# Patient Record
Sex: Female | Born: 1950 | Race: White | Hispanic: No | Marital: Single | Smoking: Never smoker
Health system: Southern US, Community
[De-identification: ages and names within clinical notes are randomized; demographics above are authoritative.]

## PROBLEM LIST (undated history)

## (undated) DIAGNOSIS — K121 Other forms of stomatitis: Secondary | ICD-10-CM

## (undated) DIAGNOSIS — K5792 Diverticulitis of intestine, part unspecified, without perforation or abscess without bleeding: Secondary | ICD-10-CM

---

## 2019-09-10 ENCOUNTER — Emergency Department (HOSPITAL_BASED_OUTPATIENT_CLINIC_OR_DEPARTMENT_OTHER)
Admission: EM | Admit: 2019-09-10 | Discharge: 2019-09-10 | Disposition: A | Discharge status: Home / Self Care | Payer: Medicare Other | Attending: Emergency Medicine | Admitting: Emergency Medicine

## 2019-09-10 ENCOUNTER — Other Ambulatory Visit: Payer: Self-pay

## 2019-09-10 ENCOUNTER — Encounter (HOSPITAL_BASED_OUTPATIENT_CLINIC_OR_DEPARTMENT_OTHER): Payer: Self-pay | Admitting: Emergency Medicine

## 2019-09-10 DIAGNOSIS — R630 Anorexia: Secondary | ICD-10-CM | POA: Diagnosis not present

## 2019-09-10 DIAGNOSIS — R109 Unspecified abdominal pain: Secondary | ICD-10-CM | POA: Diagnosis present

## 2019-09-10 DIAGNOSIS — R110 Nausea: Secondary | ICD-10-CM

## 2019-09-10 DIAGNOSIS — U071 COVID-19: Secondary | ICD-10-CM | POA: Diagnosis not present

## 2019-09-10 HISTORY — DX: Other forms of stomatitis: K12.1

## 2019-09-10 HISTORY — DX: Diverticulitis of intestine, part unspecified, without perforation or abscess without bleeding: K57.92

## 2019-09-10 LAB — COMPREHENSIVE METABOLIC PANEL
ALT: 20 U/L (ref 0–44)
AST: 29 U/L (ref 15–41)
Albumin: 3.6 g/dL (ref 3.5–5.0)
Alkaline Phosphatase: 54 U/L (ref 38–126)
Anion gap: 10 (ref 5–15)
BUN: 9 mg/dL (ref 8–23)
CO2: 29 mmol/L (ref 22–32)
Calcium: 8.7 mg/dL — ABNORMAL LOW (ref 8.9–10.3)
Chloride: 97 mmol/L — ABNORMAL LOW (ref 98–111)
Creatinine, Ser: 0.92 mg/dL (ref 0.44–1.00)
GFR calc Af Amer: >60 mL/min (ref >60)
GFR calc non Af Amer: >60 mL/min (ref >60)
Glucose, Bld: 119 mg/dL — ABNORMAL HIGH (ref 70–99)
Potassium: 3.9 mmol/L (ref 3.5–5.1)
Sodium: 136 mmol/L (ref 135–145)
Total Bilirubin: 1.1 mg/dL (ref 0.3–1.2)
Total Protein: 7 g/dL (ref 6.5–8.1)

## 2019-09-10 LAB — CBC WITH DIFFERENTIAL/PLATELET
Abs Immature Granulocytes: 0.01 10*3/uL (ref 0.00–0.07)
Basophils Absolute: 0 10*3/uL (ref 0.0–0.1)
Basophils Relative: 0 %
Eosinophils Absolute: 0 10*3/uL (ref 0.0–0.5)
Eosinophils Relative: 0 %
HCT: 49 % — ABNORMAL HIGH (ref 36.0–46.0)
Hemoglobin: 15.6 g/dL — ABNORMAL HIGH (ref 12.0–15.0)
Immature Granulocytes: 0 %
Lymphocytes Relative: 26 %
Lymphs Abs: 1.3 10*3/uL (ref 0.7–4.0)
MCH: 28.7 pg (ref 26.0–34.0)
MCHC: 31.8 g/dL (ref 30.0–36.0)
MCV: 90.1 fL (ref 80.0–100.0)
Monocytes Absolute: 0.3 10*3/uL (ref 0.1–1.0)
Monocytes Relative: 6 %
Neutro Abs: 3.4 10*3/uL (ref 1.7–7.7)
Neutrophils Relative %: 68 %
Platelets: 98 10*3/uL — ABNORMAL LOW (ref 150–400)
RBC: 5.44 MIL/uL — ABNORMAL HIGH (ref 3.87–5.11)
RDW: 12.6 % (ref 11.5–15.5)
Smear Review: NORMAL
WBC: 5.1 10*3/uL (ref 4.0–10.5)
nRBC: 0 % (ref 0.0–0.2)

## 2019-09-10 LAB — LIPASE, BLOOD: Lipase: 34 U/L (ref 11–51)

## 2019-09-10 MED ORDER — LACTATED RINGERS IV BOLUS
1000.0000 mL | Freq: Once | INTRAVENOUS | Status: AC
  Administered 2019-09-10: 1000 mL via INTRAVENOUS

## 2019-09-10 MED ORDER — ONDANSETRON HCL 4 MG PO TABS: 4.0000 mg | ORAL_TABLET | Freq: Four times a day (QID) | ORAL | 0 refills | Status: AC | PRN

## 2019-09-10 MED ORDER — PROMETHAZINE HCL 25 MG/ML IJ SOLN
INTRAMUSCULAR | Status: AC
  Filled 2019-09-10: qty 1

## 2019-09-10 MED ORDER — PROMETHAZINE HCL 25 MG/ML IJ SOLN
12.5000 mg | Freq: Once | INTRAMUSCULAR | Status: AC
  Administered 2019-09-10: 12.5 mg via INTRAVENOUS

## 2019-09-10 NOTE — ED Provider Notes (Signed)
[facility]ER[facility] DEPARTMENT Provider Note   CSN: [id number] Arrival date & time: 09/10/19  1138     History   Chief Complaint Chief Complaint  Patient presents with  . Abdominal Pain    HPI Loretta Cain is a 68 y.o. female.     HPI   68 year old female with abdominal pain and nausea.  No vomiting per se, but states she has been retching at times.  She reports that she is Covid positive.  She has no acute respiratory complaints.  Has not felt like she had a fever.  No diarrhea.  Reports poor appetite and has not been eating and drinking as much as she typically would.  Past Medical History:  Diagnosis Date  . Diverticulitis   . Ulcer (traumatic) of oral mucosa     There are no active problems to display for this patient.   History reviewed. No pertinent surgical history.   OB History   No obstetric history on file.      Home Medications    Prior to Admission medications   Medication Sig Start Date End Date Taking? Authorizing Provider  ondansetron (ZOFRAN) 4 MG tablet Take 1 tablet (4 mg total) by mouth every 6 (six) hours as needed for nausea or vomiting. 09/10/19   Virgel Manifold, MD  ondansetron (ZOFRAN-ODT) 4 MG disintegrating tablet Take 4 mg by mouth every 8 (eight) hours as needed. 09/07/19   [provider]    Family History No family history on file.  Social History Social History   Tobacco Use  . Smoking status: Never Smoker  . Smokeless tobacco: Never Used  Substance Use Topics  . Alcohol use: Never    Frequency: Never  . Drug use: Never     Allergies   Other   Review of Systems Review of Systems  All systems reviewed and negative, other than as noted in HPI.  Physical Exam Updated Vital Signs BP 111/79 (BP Location: Right Arm)   Pulse 67   Temp 98.3 F (36.8 C) (Oral)   Resp 20   Ht 5\' 7"  (1.702 m)   Wt 96.3 kg   SpO2 94%   BMI 33.25 kg/m   Physical Exam Vitals and nursing note reviewed.   Constitutional:      General: She is not in acute distress.    Appearance: She is well-developed.  HENT:     Head: Normocephalic and atraumatic.  Eyes:     General:        Right eye: No discharge.        Left eye: No discharge.     Conjunctiva/sclera: Conjunctivae normal.  Cardiovascular:     Rate and Rhythm: Normal rate and regular rhythm.     Heart sounds: Normal heart sounds. No murmur. No friction rub. No gallop.   Pulmonary:     Effort: Pulmonary effort is normal. No respiratory distress.     Breath sounds: Normal breath sounds.  Abdominal:     General: There is no distension.     Palpations: Abdomen is soft.     Tenderness: There is no abdominal tenderness.  Musculoskeletal:        General: No tenderness.     Cervical back: Neck supple.  Skin:    General: Skin is warm and dry.  Neurological:     Mental Status: She is alert.  Psychiatric:        Behavior: Behavior normal.        Thought Content: Thought  content normal.      ED Treatments / Results  Labs (all labs ordered are listed, but only abnormal results are displayed) Labs Reviewed  COMPREHENSIVE METABOLIC PANEL - Abnormal; Notable for the following components:      Result Value   Chloride 97 (*)    Glucose, Bld 119 (*)    Calcium 8.7 (*)    All other components within normal limits  CBC WITH DIFFERENTIAL/PLATELET - Abnormal; Notable for the following components:   RBC 5.44 (*)    Hemoglobin 15.6 (*)    HCT 49.0 (*)    Platelets 98 (*)    All other components within normal limits  LIPASE, BLOOD    EKG None  Radiology No results found.  Procedures Procedures (including critical care time)  Medications Ordered in ED Medications  lactated ringers bolus 1,000 mL (1,000 mLs Intravenous New Bag/Given 09/10/19 1224)  promethazine (PHENERGAN) injection 12.5 mg (12.5 mg Intravenous Given 09/10/19 1225)     Initial Impression / Assessment and Plan / ED Course  I have reviewed the triage vital signs  and the nursing notes.  Pertinent labs & imaging results that were available during my care of the patient were reviewed by me and considered in my medical decision making (see chart for details).      68 year old female who was apparently Covid positive.  Primarily GI symptoms.  Denies any respiratory symptoms.  Oxygen saturation is fine on room air.  She has no increased work of breathing.  She was treated symptomatically with improvement of her symptoms.  Plan continue symptomatic treatment.  Quarantine & return precautions discussed.  Loretta Cain was evaluated in Emergency Department on 09/10/2019 for the symptoms described in the history of present illness. She was evaluated in the context of the global COVID-19 pandemic, which necessitated consideration that the patient might be at risk for infection with the SARS-CoV-2 virus that causes COVID-19. Institutional protocols and algorithms that pertain to the evaluation of patients at risk for COVID-19 are in a state of rapid change based on information released by regulatory bodies including the CDC and federal and state organizations. These policies and algorithms were followed during the patient's care in the ED.   Final Clinical Impressions(s) / ED Diagnoses   Final diagnoses:  Nausea  Anorexia    ED Discharge Orders         Ordered    ondansetron (ZOFRAN) 4 MG tablet  Every 6 hours PRN     09/10/19 1418           Raeford Razor, MD 09/11/19 1029

## 2019-09-10 NOTE — ED Triage Notes (Signed)
Abdominal pain and nausea for 8 days. Headache. Fatigue and weakness.  No appetite and decreased intake. Denies fever.

## 2019-09-10 NOTE — Discharge Instructions (Signed)
Person Under Monitoring Name: Loretta Cain  Location: [address] [place]  [id number]   Infection Prevention Recommendations for Individuals Confirmed to have, or Being Evaluated for, 2019 Novel Coronavirus (COVID-19) Infection Who Receive Care at Home  Individuals who are confirmed to have, or are being evaluated for, COVID-19 should follow the prevention steps below until a healthcare provider or local or state health department says they can return to normal activities.  Stay home except to get medical care You should restrict activities outside your home, except for getting medical care. Do not go to work, school, or public areas, and do not use public transportation or taxis.  Call ahead before visiting your doctor Before your medical appointment, call the healthcare provider and tell them that you have, or are being evaluated for, COVID-19 infection. This will help the healthcare providers office take steps to keep other people from getting infected. Ask your healthcare provider to call the local or state health department.  Monitor your symptoms Seek prompt medical attention if your illness is worsening (e.g., difficulty breathing). Before going to your medical appointment, call the healthcare provider and tell them that you have, or are being evaluated for, COVID-19 infection. Ask your healthcare provider to call the local or state health department.  Wear a facemask You should wear a facemask that covers your nose and mouth when you are in the same room with other people and when you visit a healthcare provider. People who live with or visit you should also wear a facemask while they are in the same room with you.  Separate yourself from other people in your home As much as possible, you should stay in a different room from other people in your home. Also, you should use a separate bathroom, if available.  Avoid sharing household items You should not share  dishes, drinking glasses, cups, eating utensils, towels, bedding, or other items with other people in your home. After using these items, you should wash them thoroughly with soap and water.  Cover your coughs and sneezes Cover your mouth and nose with a tissue when you cough or sneeze, or you can cough or sneeze into your sleeve. Throw used tissues in a lined trash can, and immediately wash your hands with soap and water for at least 20 seconds or use an alcohol-based hand rub.  Wash your [employer] your hands often and thoroughly with soap and water for at least 20 seconds. You can use an alcohol-based hand sanitizer if soap and water are not available and if your hands are not visibly dirty. Avoid touching your eyes, nose, and mouth with unwashed hands.   Prevention Steps for Caregivers and Household Members of Individuals Confirmed to have, or Being Evaluated for, COVID-19 Infection Being Cared for in the Home  If you live with, or provide care at home for, a person confirmed to have, or being evaluated for, COVID-19 infection please follow these guidelines to prevent infection:  Follow healthcare providers instructions Make sure that you understand and can help the patient follow any healthcare provider instructions for all care.  Provide for the patients basic needs You should help the patient with basic needs in the home and provide support for getting groceries, prescriptions, and other personal needs.  Monitor the patients symptoms If they are getting sicker, call his or her medical provider and tell them that the patient has, or is being evaluated for, COVID-19 infection. This will help the healthcare providers office take  steps to keep other people from getting infected. Ask the healthcare provider to call the local or state health department.  Limit the number of people who have contact with the patient If possible, have only one caregiver for the patient. Other  household members should stay in another home or place of residence. If this is not possible, they should stay in another room, or be separated from the patient as much as possible. Use a separate bathroom, if available. Restrict visitors who do not have an essential need to be in the home.  Keep older adults, very young children, and other sick people away from the patient Keep older adults, very young children, and those who have compromised immune systems or chronic health conditions away from the patient. This includes people with chronic heart, lung, or kidney conditions, diabetes, and cancer.  Ensure good ventilation Make sure that shared spaces in the home have good air flow, such as from an air conditioner or an opened window, weather permitting.  Wash your hands often Wash your hands often and thoroughly with soap and water for at least 20 seconds. You can use an alcohol based hand sanitizer if soap and water are not available and if your hands are not visibly dirty. Avoid touching your eyes, nose, and mouth with unwashed hands. Use disposable paper towels to dry your hands. If not available, use dedicated cloth towels and replace them when they become wet.  Wear a facemask and gloves Wear a disposable facemask at all times in the room and gloves when you touch or have contact with the patients blood, body fluids, and/or secretions or excretions, such as sweat, saliva, sputum, nasal mucus, vomit, urine, or feces.  Ensure the mask fits over your nose and mouth tightly, and do not touch it during use. Throw out disposable facemasks and gloves after using them. Do not reuse. Wash your hands immediately after removing your facemask and gloves. If your personal clothing becomes contaminated, carefully remove clothing and launder. Wash your hands after handling contaminated clothing. Place all used disposable facemasks, gloves, and other waste in a lined container before disposing them with  other household waste. Remove gloves and wash your hands immediately after handling these items.  Do not share dishes, glasses, or other household items with the patient Avoid sharing household items. You should not share dishes, drinking glasses, cups, eating utensils, towels, bedding, or other items with a patient who is confirmed to have, or being evaluated for, COVID-19 infection. After the person uses these items, you should wash them thoroughly with soap and water.  Wash laundry thoroughly Immediately remove and wash clothes or bedding that have blood, body fluids, and/or secretions or excretions, such as sweat, saliva, sputum, nasal mucus, vomit, urine, or feces, on them. Wear gloves when handling laundry from the patient. Read and follow directions on labels of laundry or clothing items and detergent. In general, wash and dry with the warmest temperatures recommended on the label.  Clean all areas the individual has used often Clean all touchable surfaces, such as counters, tabletops, doorknobs, bathroom fixtures, toilets, phones, keyboards, tablets, and bedside tables, every day. Also, clean any surfaces that may have blood, body fluids, and/or secretions or excretions on them. Wear gloves when cleaning surfaces the patient has come in contact with. Use a diluted bleach solution (e.g., dilute bleach with 1 part bleach and 10 parts water) or a household disinfectant with a label that says EPA-registered for coronaviruses. To make a bleach solution  at home, add 1 tablespoon of bleach to 1 quart (4 cups) of water. For a larger supply, add  cup of bleach to 1 gallon (16 cups) of water. Read labels of cleaning products and follow recommendations provided on product labels. Labels contain instructions for safe and effective use of the cleaning product including precautions you should take when applying the product, such as wearing gloves or eye protection and making sure you have good ventilation  during use of the product. Remove gloves and wash hands immediately after cleaning.  Monitor yourself for signs and symptoms of illness Caregivers and household members are considered close contacts, should monitor their health, and will be asked to limit movement outside of the home to the extent possible. Follow the monitoring steps for close contacts listed on the symptom monitoring form.   ? If you have additional questions, contact your local health department or call the epidemiologist on call at [phone] (available 24/7). ? This guidance is subject to change. For the most up-to-date guidance from [facility], please refer to their website: [email or web address].pl

## 2019-09-10 NOTE — ED Notes (Signed)
ED Provider at bedside. 

## 2020-02-26 ENCOUNTER — Emergency Department (HOSPITAL_BASED_OUTPATIENT_CLINIC_OR_DEPARTMENT_OTHER)
Admission: EM | Admit: 2020-02-26 | Discharge: 2020-02-26 | Disposition: A | Discharge status: Home / Self Care | Payer: Medicare Other | Attending: Emergency Medicine | Admitting: Emergency Medicine

## 2020-02-26 ENCOUNTER — Other Ambulatory Visit: Payer: Self-pay

## 2020-02-26 ENCOUNTER — Emergency Department (HOSPITAL_BASED_OUTPATIENT_CLINIC_OR_DEPARTMENT_OTHER): Payer: Medicare Other

## 2020-02-26 ENCOUNTER — Encounter (HOSPITAL_BASED_OUTPATIENT_CLINIC_OR_DEPARTMENT_OTHER): Payer: Self-pay | Admitting: *Deleted

## 2020-02-26 DIAGNOSIS — Z8616 Personal history of COVID-19: Secondary | ICD-10-CM | POA: Diagnosis not present

## 2020-02-26 DIAGNOSIS — R1013 Epigastric pain: Secondary | ICD-10-CM | POA: Diagnosis not present

## 2020-02-26 DIAGNOSIS — K219 Gastro-esophageal reflux disease without esophagitis: Secondary | ICD-10-CM | POA: Insufficient documentation

## 2020-02-26 DIAGNOSIS — R110 Nausea: Secondary | ICD-10-CM | POA: Diagnosis present

## 2020-02-26 DIAGNOSIS — Z79899 Other long term (current) drug therapy: Secondary | ICD-10-CM | POA: Diagnosis not present

## 2020-02-26 LAB — COMPREHENSIVE METABOLIC PANEL
ALT: 24 U/L (ref 0–44)
AST: 27 U/L (ref 15–41)
Albumin: 4 g/dL (ref 3.5–5.0)
Alkaline Phosphatase: 79 U/L (ref 38–126)
Anion gap: 13 (ref 5–15)
BUN: 11 mg/dL (ref 8–23)
CO2: 24 mmol/L (ref 22–32)
Calcium: 9 mg/dL (ref 8.9–10.3)
Chloride: 101 mmol/L (ref 98–111)
Creatinine, Ser: 1.07 mg/dL — ABNORMAL HIGH (ref 0.44–1.00)
GFR calc Af Amer: >60 mL/min (ref >60)
GFR calc non Af Amer: 53 mL/min — ABNORMAL LOW (ref >60)
Glucose, Bld: 127 mg/dL — ABNORMAL HIGH (ref 70–99)
Potassium: 4 mmol/L (ref 3.5–5.1)
Sodium: 138 mmol/L (ref 135–145)
Total Bilirubin: 1.3 mg/dL — ABNORMAL HIGH (ref 0.3–1.2)
Total Protein: 7.3 g/dL (ref 6.5–8.1)

## 2020-02-26 LAB — CBC WITH DIFFERENTIAL/PLATELET
Abs Immature Granulocytes: 0.04 10*3/uL (ref 0.00–0.07)
Basophils Absolute: 0 10*3/uL (ref 0.0–0.1)
Basophils Relative: 0 %
Eosinophils Absolute: 0 10*3/uL (ref 0.0–0.5)
Eosinophils Relative: 0 %
HCT: 47.1 % — ABNORMAL HIGH (ref 36.0–46.0)
Hemoglobin: 15.4 g/dL — ABNORMAL HIGH (ref 12.0–15.0)
Immature Granulocytes: 1 %
Lymphocytes Relative: 19 %
Lymphs Abs: 1.6 10*3/uL (ref 0.7–4.0)
MCH: 30 pg (ref 26.0–34.0)
MCHC: 32.7 g/dL (ref 30.0–36.0)
MCV: 91.6 fL (ref 80.0–100.0)
Monocytes Absolute: 0.7 10*3/uL (ref 0.1–1.0)
Monocytes Relative: 9 %
Neutro Abs: 6.1 10*3/uL (ref 1.7–7.7)
Neutrophils Relative %: 71 %
Platelets: 173 10*3/uL (ref 150–400)
RBC: 5.14 MIL/uL — ABNORMAL HIGH (ref 3.87–5.11)
RDW: 13.6 % (ref 11.5–15.5)
WBC: 8.5 10*3/uL (ref 4.0–10.5)
nRBC: 0 % (ref 0.0–0.2)

## 2020-02-26 LAB — URINALYSIS, ROUTINE W REFLEX MICROSCOPIC
Bilirubin Urine: NEGATIVE
Glucose, UA: NEGATIVE mg/dL
Ketones, ur: NEGATIVE mg/dL
Leukocytes,Ua: NEGATIVE
Nitrite: NEGATIVE
Protein, ur: >300 mg/dL — AB
Specific Gravity, Urine: 1.02 (ref 1.005–1.030)
pH: 6 (ref 5.0–8.0)

## 2020-02-26 LAB — URINALYSIS, MICROSCOPIC (REFLEX): WBC, UA: NONE SEEN WBC/hpf (ref 0–5)

## 2020-02-26 LAB — LIPASE, BLOOD: Lipase: 29 U/L (ref 11–51)

## 2020-02-26 MED ORDER — ONDANSETRON HCL 4 MG/2ML IJ SOLN
4.0000 mg | Freq: Once | INTRAMUSCULAR | Status: AC
Start: 2020-02-26 — End: 2020-02-26
  Administered 2020-02-26: 4 mg via INTRAVENOUS
  Filled 2020-02-26: qty 2

## 2020-02-26 MED ORDER — SUCRALFATE 1 G PO TABS: 1.0000 g | ORAL_TABLET | Freq: Three times a day (TID) | ORAL | 0 refills | Status: AC

## 2020-02-26 MED ORDER — MORPHINE SULFATE (PF) 4 MG/ML IV SOLN
4.0000 mg | Freq: Once | INTRAVENOUS | Status: AC
  Administered 2020-02-26: 4 mg via INTRAVENOUS
  Filled 2020-02-26: qty 1

## 2020-02-26 MED ORDER — SODIUM CHLORIDE 0.9 % IV BOLUS
1000.0000 mL | Freq: Once | INTRAVENOUS | Status: AC
  Administered 2020-02-26: 1000 mL via INTRAVENOUS

## 2020-02-26 MED ORDER — IOHEXOL 300 MG/ML  SOLN
100.0000 mL | Freq: Once | INTRAMUSCULAR | Status: AC | PRN
  Administered 2020-02-26: 100 mL via INTRAVENOUS

## 2020-02-26 NOTE — ED Provider Notes (Signed)
[facility] EMERGENCY DEPARTMENT Provider Note   CSN: [id number] Arrival date & time: 02/26/20  1447     History Chief Complaint  Patient presents with  . Nausea    Loretta Cain is a 69 y.o. female.  Pt presents to the ED today with nausea and abdominal pain.  The pt said she has been feeling bad for about 1.5 weeks.  She went to UC thinking she may have Covid.  She had a negative Ag and PCR test on 5/21. Pt went back on 5/24 and was told she had a UTI and was put on Ceftin.  She is not feeling any better.  She has a lot of "acid" and abdominal cramping.  No f/c.  She had Covid in December.  No Covid vaccine.        Past Medical History:  Diagnosis Date  . Diverticulitis   . Ulcer (traumatic) of oral mucosa     There are no problems to display for this patient.   History reviewed. No pertinent surgical history.   OB History   No obstetric history on file.     No family history on file.  Social History   Tobacco Use  . Smoking status: Never Smoker  . Smokeless tobacco: Never Used  Substance Use Topics  . Alcohol use: Never  . Drug use: Never    Home Medications Prior to Admission medications   Medication Sig Start Date End Date Taking? Authorizing Provider  cefUROXime (CEFTIN) 250 MG tablet Take by mouth. 02/23/20 03/01/20 Yes [provider]  fluticasone (FLONASE) 50 MCG/ACT nasal spray Place into the nose. 02/20/20  Yes [provider]  ondansetron (ZOFRAN) 4 MG tablet Take by mouth. 02/23/20 03/07/20 Yes [provider]  pantoprazole (PROTONIX) 40 MG tablet Take by mouth. 02/23/20 03/24/20 Yes [provider]  ondansetron (ZOFRAN) 4 MG tablet Take 1 tablet (4 mg total) by mouth every 6 (six) hours as needed for nausea or vomiting. 09/10/19   Raeford Razor, MD  ondansetron (ZOFRAN-ODT) 4 MG disintegrating tablet Take 4 mg by mouth every 8 (eight) hours as needed. 09/07/19   [provider]  sucralfate (CARAFATE)  1 g tablet Take 1 tablet (1 g total) by mouth 4 (four) times daily -  with meals and at bedtime for 15 days. 02/26/20 03/12/20  Jacalyn Lefevre, MD    Allergies    Other  Review of Systems   Review of Systems  Gastrointestinal: Positive for abdominal pain and nausea.  All other systems reviewed and are negative.   Physical Exam Updated Vital Signs BP (!) 134/108 (BP Location: Right Arm)   Pulse (!) 53   Temp 98.4 F (36.9 C) (Oral)   Resp 16   Ht 5\' 7"  (1.702 m)   Wt 96.2 kg   SpO2 95%   BMI 33.20 kg/m   Physical Exam Vitals and nursing note reviewed.  Constitutional:      Appearance: Normal appearance.  HENT:     Head: Normocephalic and atraumatic.     Right Ear: External ear normal.     Left Ear: External ear normal.     Nose: Nose normal.     Mouth/Throat:     Mouth: Mucous membranes are moist.     Pharynx: Oropharynx is clear.  Eyes:     Extraocular Movements: Extraocular movements intact.     Conjunctiva/sclera: Conjunctivae normal.     Pupils: Pupils are equal, round, and reactive to light.  Cardiovascular:  Rate and Rhythm: Normal rate and regular rhythm.     Pulses: Normal pulses.     Heart sounds: Normal heart sounds.  Pulmonary:     Effort: Pulmonary effort is normal.     Breath sounds: Normal breath sounds.  Abdominal:     General: Abdomen is flat. Bowel sounds are normal.     Palpations: Abdomen is soft.     Tenderness: There is abdominal tenderness in the epigastric area.  Musculoskeletal:        General: Normal range of motion.     Cervical back: Normal range of motion and neck supple.  Skin:    General: Skin is warm.     Capillary Refill: Capillary refill takes less than 2 seconds.  Neurological:     General: No focal deficit present.     Mental Status: She is alert and oriented to person, place, and time.  Psychiatric:        Mood and Affect: Mood normal.        Behavior: Behavior normal.        Thought Content: Thought content normal.         Judgment: Judgment normal.     ED Results / Procedures / Treatments   Labs (all labs ordered are listed, but only abnormal results are displayed) Labs Reviewed  CBC WITH DIFFERENTIAL/PLATELET - Abnormal; Notable for the following components:      Result Value   RBC 5.14 (*)    Hemoglobin 15.4 (*)    HCT 47.1 (*)    All other components within normal limits  COMPREHENSIVE METABOLIC PANEL - Abnormal; Notable for the following components:   Glucose, Bld 127 (*)    Creatinine, Ser 1.07 (*)    Total Bilirubin 1.3 (*)    GFR calc non Af Amer 53 (*)    All other components within normal limits  URINALYSIS, ROUTINE W REFLEX MICROSCOPIC - Abnormal; Notable for the following components:   Hgb urine dipstick SMALL (*)    Protein, ur >300 (*)    All other components within normal limits  URINALYSIS, MICROSCOPIC (REFLEX) - Abnormal; Notable for the following components:   Bacteria, UA RARE (*)    All other components within normal limits  LIPASE, BLOOD    EKG None  Radiology DG Chest 2 View  Result Date: 02/26/2020 CLINICAL DATA:  Nausea. Recent diagnosis of urinary tract infection. EXAM: CHEST - 2 VIEW COMPARISON:  None. FINDINGS: Lateral view degraded by patient arm position. Patient rotated right. Midline trachea. Moderate cardiomegaly. Tortuous thoracic aorta. No pleural effusion or pneumothorax. No congestive failure. Clear lungs. Mild left hemidiaphragm elevation. IMPRESSION: Cardiomegaly without congestive failure. No acute findings. Electronically Signed   By: Jeronimo Greaves M.D.   On: 02/26/2020 18:12   CT ABDOMEN PELVIS W CONTRAST  Result Date: 02/26/2020 CLINICAL DATA:  Abdominal pain. Sinus drainage. Abdominal cramps. Urinary tract infection. On antibiotics. EXAM: CT ABDOMEN AND PELVIS WITH CONTRAST TECHNIQUE: Multidetector CT imaging of the abdomen and pelvis was performed using the standard protocol following bolus administration of intravenous contrast. CONTRAST:   OMNIPAQUE IOHEXOL 300 MG/ML  SOLN COMPARISON:  None. FINDINGS: Lower chest: Bibasilar scarring. Moderate cardiomegaly, without pericardial or pleural effusion. Hepatobiliary: Mild hepatomegaly at 18.0 cm craniocaudal. Elevated right heart pressures, as evidenced by enlarged hepatic veins and intrahepatic IVC. No focal liver lesion. Normal gallbladder, without biliary ductal dilatation. Pancreas: Normal, without mass or ductal dilatation. Spleen: Normal in size, without focal abnormality. Adrenals/Urinary Tract: Normal adrenal  glands. Normal kidneys, without hydronephrosis. Normal urinary bladder. Stomach/Bowel: Posterior gastric diverticulum on 14/2. Colonic stool burden suggests constipation. Normal terminal ileum and appendix. Normal small bowel. Vascular/Lymphatic: Aortic and branch vessel atherosclerosis. No abdominopelvic adenopathy. Reproductive: Normal uterus and adnexa. Other: Small to moderate volume pelvic fluid. Trace perihepatic and Peri splenic ascites. Musculoskeletal: Osteopenia. Degenerate disc disease at the lumbosacral junction. Mild convex right lumbar spine curvature. IMPRESSION: 1. Possible constipation. No other explanation for abdominal pain. 2. Elevated right heart pressures, as evidenced by a large hepatic veins and intrahepatic IVC. Trace abdominal and small to moderate volume pelvic fluid could be secondary. 3. Mild hepatomegaly. 4. Aortic Atherosclerosis (ICD10-I70.0). Electronically Signed   By: Abigail Miyamoto M.D.   On: 02/26/2020 18:28    Procedures Procedures (including critical care time)  Medications Ordered in ED Medications  sodium chloride 0.9 % bolus 1,000 mL ( Intravenous Stopped 02/26/20 1836)  morphine 4 MG/ML injection 4 mg (4 mg Intravenous Given 02/26/20 1708)  ondansetron (ZOFRAN) injection 4 mg (4 mg Intravenous Given 02/26/20 1708)  iohexol (OMNIPAQUE) 300 MG/ML solution 100 mL (100 mLs Intravenous Contrast Given 02/26/20 1747)    ED Course  I have reviewed the  triage vital signs and the nursing notes.  Pertinent labs & imaging results that were available during my care of the patient were reviewed by me and considered in my medical decision making (see chart for details).    MDM Rules/Calculators/A&P                     Pt is feeling much better.  She is able to tolerate po fluids.  I think pt most likely has GERD.  She has not seen GI since she had an ulcer in 2003.  She was just put on protonix and sucralfate.  She is told to continue those meds and to f/u with GI.  Return if worse. Final Clinical Impression(s) / ED Diagnoses Final diagnoses:  Epigastric pain  Gastroesophageal reflux disease, unspecified whether esophagitis present    Rx / DC Orders ED Discharge Orders         Ordered    sucralfate (CARAFATE) 1 g tablet  3 times daily with meals & bedtime     02/26/20 1948           Isla Pence, MD 02/26/20 1950

## 2020-02-26 NOTE — Discharge Instructions (Signed)
Stop the Ceftin.

## 2020-02-26 NOTE — ED Triage Notes (Addendum)
Sinus drainage for over a week. Nausea. Not sleeping, abdominal cramps. She looks SOB. She was treated at [facility] for a virus. At that time she had a negative Covid test at that time. She went back to UC for a second time due to not feeling better and she had a UTI. She was started on antibiotics.

## 2021-11-03 IMAGING — CT CT ABD-PELV W/ CM
2 of 5 series · 16 of 46 positions shown, 18 images · IV contrast (Omnipaque)
Comparison: None.

CLINICAL DATA: Abdominal pain. Sinus drainage. Abdominal cramps.
Urinary tract infection. On antibiotics.

EXAM:
CT ABDOMEN AND PELVIS WITH CONTRAST
TECHNIQUE: Multidetector CT imaging of the abdomen and pelvis was performed
using the standard protocol following bolus administration of
intravenous contrast.
CONTRAST:  100mL OMNIPAQUE IOHEXOL 300 MG/ML  SOLN

[Series 2: axial st · axial · 0.77mm/px · z∈[-530,-156]mm · 13 of 85 slices shown, 15 images]
[im 5/85  soft-tissue]
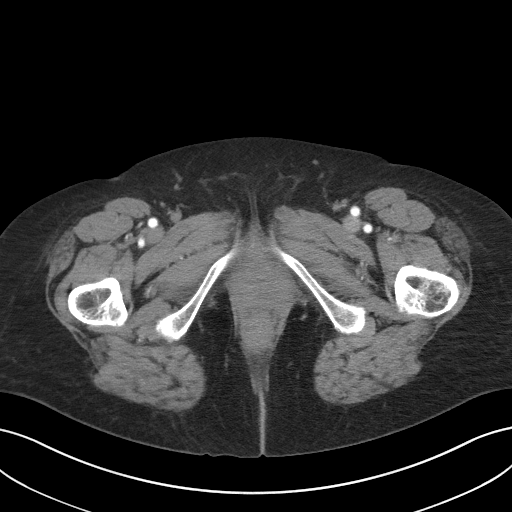
[im 5/85  bone]
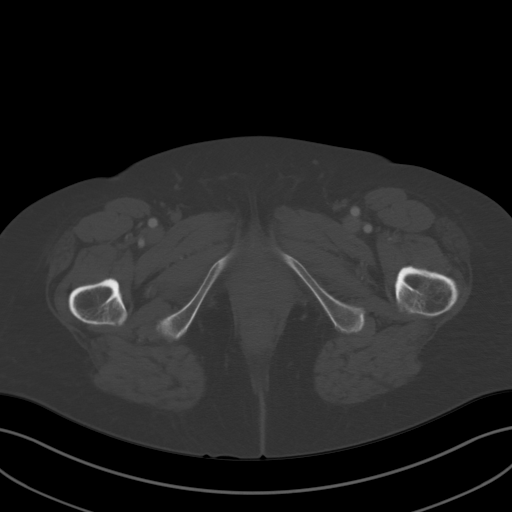
[im 10/85  soft-tissue]
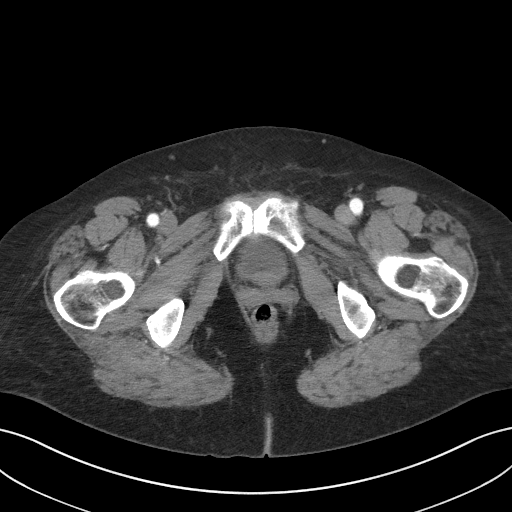
[im 19/85  soft-tissue]
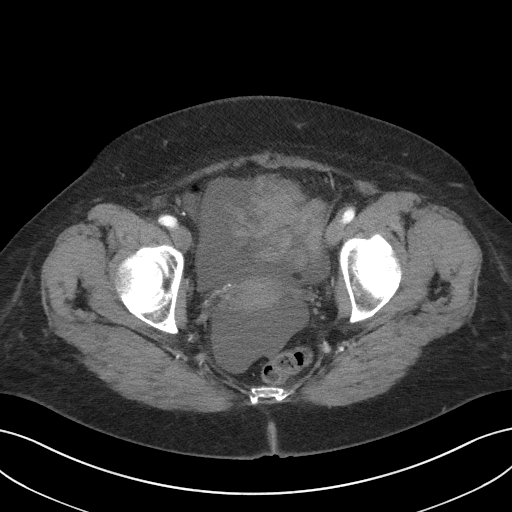
[im 24/85  soft-tissue]
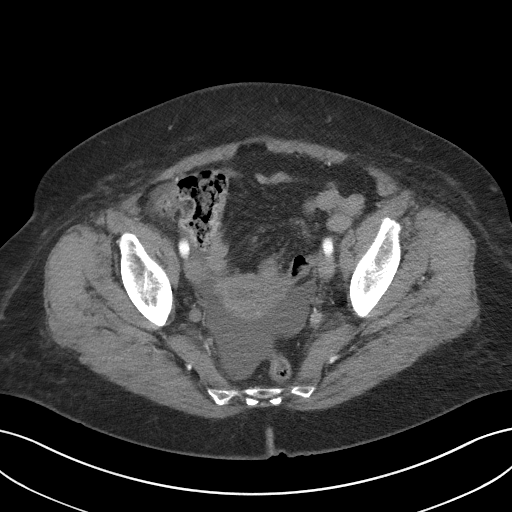
[im 29/85  soft-tissue]
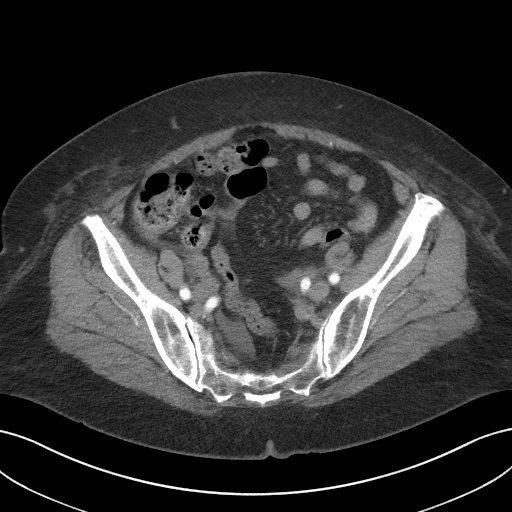
[im 38/85  soft-tissue]
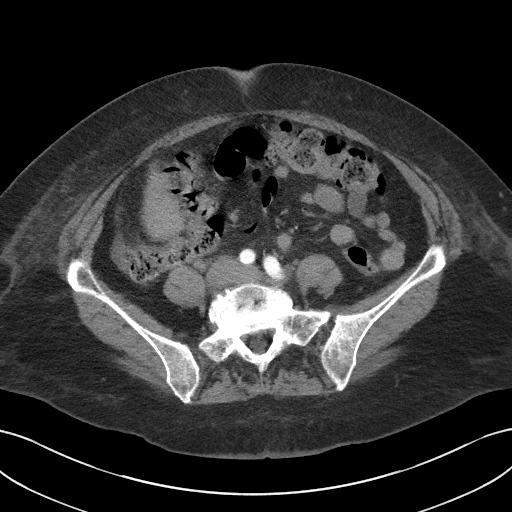
[im 43/85  soft-tissue]
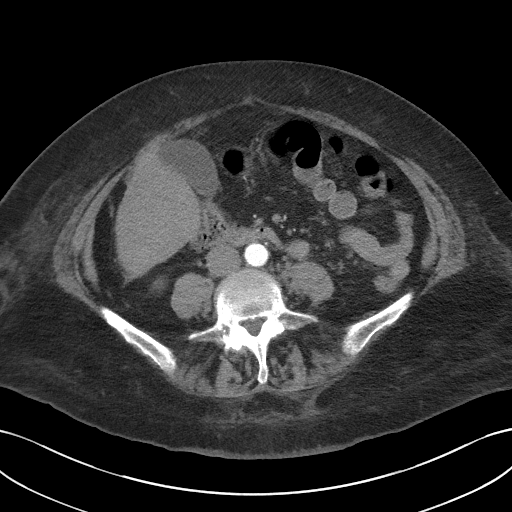
[im 47/85  soft-tissue]
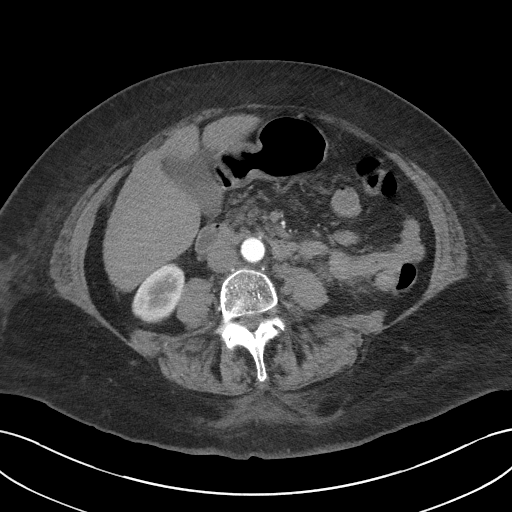
[im 57/85  soft-tissue]
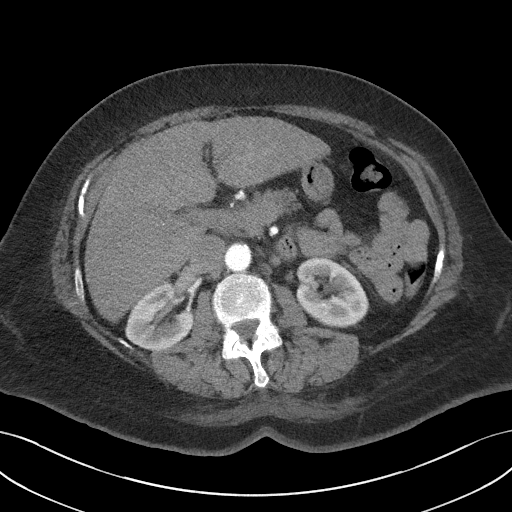
[im 57/85  bone]
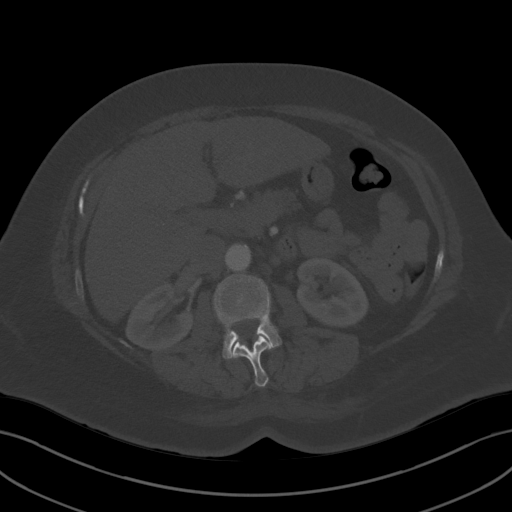
[im 61/85  soft-tissue]
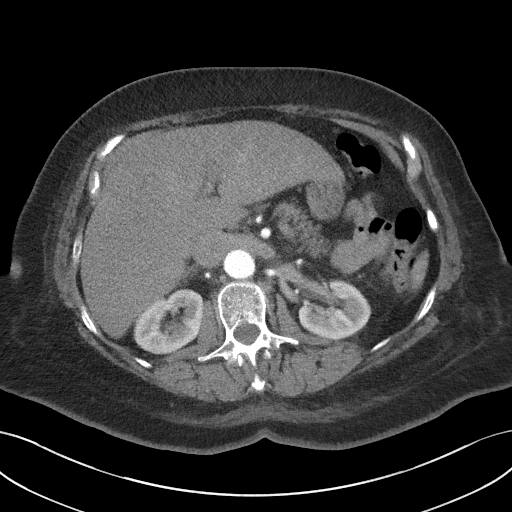
[im 66/85  soft-tissue]
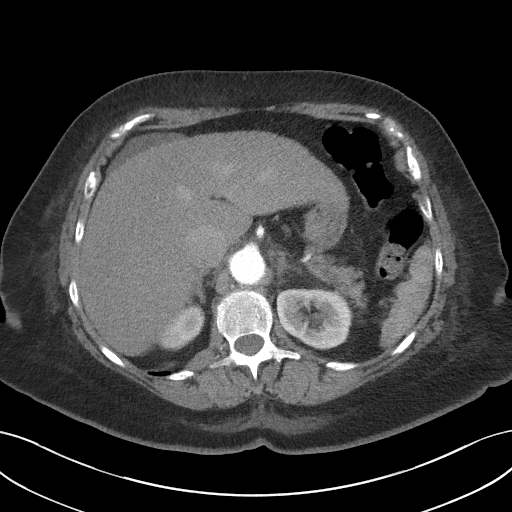
[im 75/85  soft-tissue]
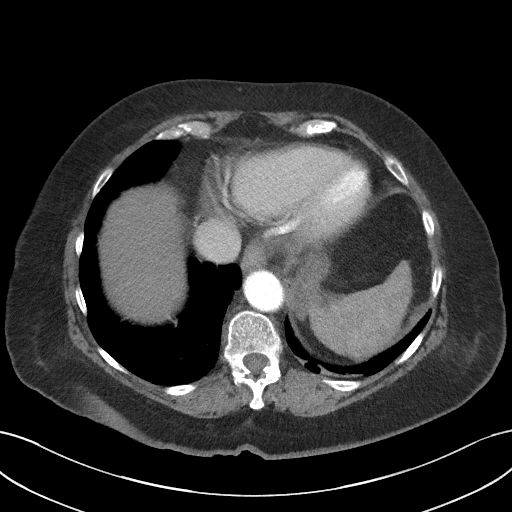
[im 80/85  soft-tissue]
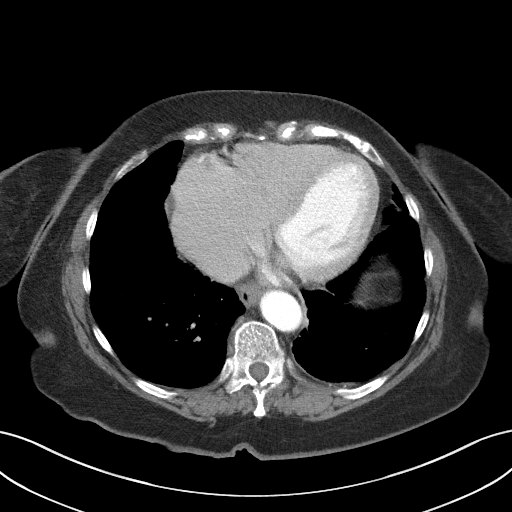

[Series 5: coronal st · coronal · 0.77mm/px · 3 of 106 slices shown]
[im 36/106  soft-tissue]
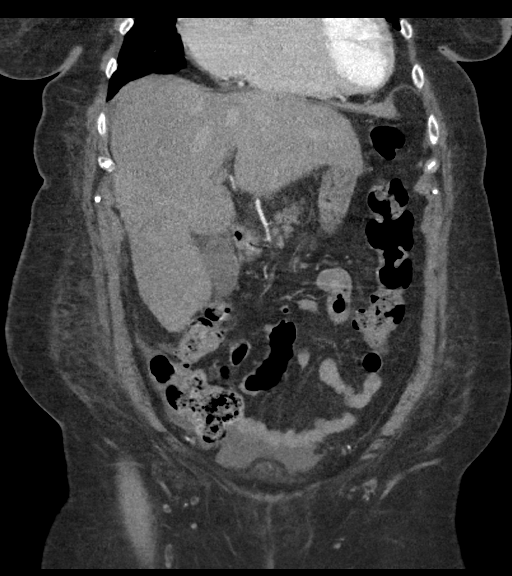
[im 47/106  soft-tissue]
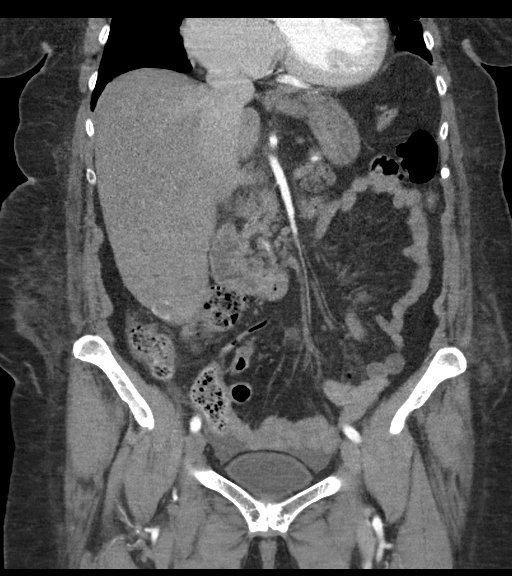
[im 59/106  soft-tissue]
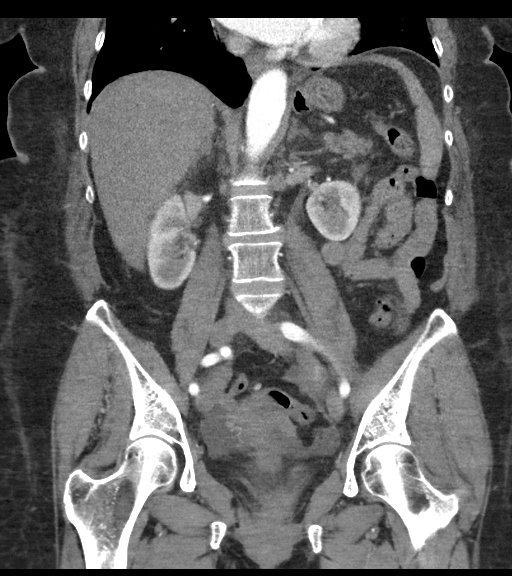

[16 of 46 positions shown; findings below may reference images not displayed]

FINDINGS: Lower chest: Bibasilar scarring. Moderate cardiomegaly, without
pericardial or pleural effusion.

Hepatobiliary: Mild hepatomegaly at 18.0 cm craniocaudal. Elevated
right heart pressures, as evidenced by enlarged hepatic veins and
intrahepatic IVC. No focal liver lesion. Normal gallbladder, without
biliary ductal dilatation.

Pancreas: Normal, without mass or ductal dilatation.

Spleen: Normal in size, without focal abnormality.

Adrenals/Urinary Tract: Normal adrenal glands. Normal kidneys,
without hydronephrosis. Normal urinary bladder.

Stomach/Bowel: Posterior gastric diverticulum on [DATE]. Colonic stool
burden suggests constipation. Normal terminal ileum and appendix.
Normal small bowel.

Vascular/Lymphatic: Aortic and branch vessel atherosclerosis. No
abdominopelvic adenopathy.

Reproductive: Normal uterus and adnexa.

Other: Small to moderate volume pelvic fluid. Trace perihepatic and
Peri splenic ascites.

Musculoskeletal: Osteopenia. Degenerate disc disease at the
lumbosacral junction. Mild convex right lumbar spine curvature.
IMPRESSION: 1. Possible constipation. No other explanation for abdominal pain.
2. Elevated right heart pressures, as evidenced by a large hepatic
veins and intrahepatic IVC. Trace abdominal and small to moderate
volume pelvic fluid could be secondary.
3. Mild hepatomegaly.
4. Aortic Atherosclerosis ([id number]).
